# Patient Record
Sex: Male | Born: 1990 | Race: White | Hispanic: Yes | Marital: Single | Smoking: Never smoker
Health system: Southern US, Community
[De-identification: ages and names within clinical notes are randomized; demographics above are authoritative.]

---

## 2019-06-08 ENCOUNTER — Ambulatory Visit (INDEPENDENT_AMBULATORY_CARE_PROVIDER_SITE_OTHER): Payer: Self-pay

## 2019-06-08 ENCOUNTER — Ambulatory Visit
Admission: EM | Admit: 2019-06-08 | Discharge: 2019-06-08 | Disposition: A | Discharge status: Home / Self Care | Payer: Self-pay | Attending: Emergency Medicine | Admitting: Emergency Medicine

## 2019-06-08 ENCOUNTER — Other Ambulatory Visit: Payer: Self-pay

## 2019-06-08 DIAGNOSIS — W2209XA Striking against other stationary object, initial encounter: Secondary | ICD-10-CM

## 2019-06-08 DIAGNOSIS — M79641 Pain in right hand: Secondary | ICD-10-CM

## 2019-06-08 DIAGNOSIS — S62336A Displaced fracture of neck of fifth metacarpal bone, right hand, initial encounter for closed fracture: Secondary | ICD-10-CM

## 2019-06-08 MED ORDER — HYDROCODONE-ACETAMINOPHEN 5-325 MG PO TABS: 1.0000 | ORAL_TABLET | Freq: Four times a day (QID) | ORAL | 0 refills | Status: AC | PRN

## 2019-06-08 NOTE — Discharge Instructions (Addendum)
Continue ice, elevate above your heart is much as possible.  Take a Tylenol containing product 3-4 times a day as needed for pain.  Other 1000 mg of Tylenol for mild to moderate pain or 1-2 Norco for severe pain.  Do not exceed 4000 mg of Tylenol from all sources in 1 day.  Follow-up with Dr. Elder Cyphers for orthopedics, with [facility] urgent care within the week.  This may require surgical management.

## 2019-06-08 NOTE — ED Triage Notes (Signed)
Pt reports he punched a metal door yesterday afternoon and now with pain and swelling to right hand

## 2019-06-08 NOTE — ED Provider Notes (Signed)
HPI  SUBJECTIVE:  Tony Ferguson is a right-handed 29 y.o. male who presents with pain, swelling in his right lateral hand after punching a metal door yesterday.  He reports sharp intermittent pain in this area and some limitation of motion of his little finger.  Pain is present with activity and palpation.  He denies numbness or tingling in his fingers, bruising.  States the rest of his hand and wrist are without injury.  He tried ice with improvement in his symptoms.  Symptoms are worse with palpation, movement of the little finger.  Past medical history negative for diabetes, hypertension, smoking.  PMD: None.  History reviewed. No pertinent past medical history.  History reviewed. No pertinent surgical history.  History reviewed. No pertinent family history.  Social History   Tobacco Use  . Smoking status: Never Smoker  . Smokeless tobacco: Never Used  Substance Use Topics  . Alcohol use: Yes    Comment: social  . Drug use: Not Currently    No current facility-administered medications for this encounter.  Current Outpatient Medications:  .  HYDROcodone-acetaminophen (NORCO/VICODIN) 5-325 MG tablet, Take 1-2 tablets by mouth every 6 (six) hours as needed for moderate pain or severe pain., Disp: 12 tablet, Rfl: 0  No Known Allergies   ROS  As noted in HPI.   Physical Exam  BP (!) 141/76   Pulse 88   Temp 98.3 F (36.8 C) (Oral)   Resp 18   Ht 5\' 11"  (1.803 m)   Wt 122.5 kg   SpO2 99%   BMI 37.66 kg/m   Constitutional: Well developed, well nourished, no acute distress Eyes:  EOMI, conjunctiva normal bilaterally HENT: Normocephalic, atraumatic,mucus membranes moist Respiratory: Normal inspiratory effort Cardiovascular: Normal rate GI: nondistended skin: No rash, skin intact Musculoskeletal:  R hand: positive tender swelling over the distal fifth metacarpal of the right hand.  No obvious bruising.  No rotational deformity of the little finger.  Skin intact. light  touch intact for Pt, distal motor and sensation in median/radial/ulnar nerve distribution with CR< 2 secs and pulse intact.  Some limitation of motion of the little finger but patient is able to move it.  Rest of the hand nontender, normal, wrist WNL.  Patient has full range of motion of the wrist. Neurologic: Alert & oriented x 3, no focal neuro deficits Psychiatric: Speech and behavior appropriate   ED Course   Medications - No data to display  Orders Placed This Encounter  Procedures  . DG Hand Complete Right    Standing Status:   Standing    Number of Occurrences:   1    Order Specific Question:   Reason for Exam (SYMPTOM  OR DIAGNOSIS REQUIRED)    Answer:   pain  . Apply splint Ulnar Gutter    Standing Status:   Standing    Number of Occurrences:   1    Order Specific Question:   Laterality    Answer:   Right    No results found for this or any previous visit (from the past 24 hour(s)). DG Hand Complete Right  Result Date: 06/08/2019 CLINICAL DATA:  Pain after punching a metal door EXAM: RIGHT HAND - COMPLETE 3+ VIEW COMPARISON:  None. FINDINGS: Comminuted fracture of the fifth metacarpal neck with apex dorsal angulation. No other acute fracture or dislocation. No aggressive osseous lesion. Soft tissues are unremarkable. IMPRESSION: Comminuted fracture of the fifth metacarpal neck with apex dorsal angulation. Electronically Signed   By: Elbert Ewings  Allena Katz   On: 06/08/2019 13:51    ED Clinical Impression  1. Closed displaced fracture of neck of fifth metacarpal bone of right hand, initial encounter      ED Assessment/Plan  [place] [facility] database reviewed for this patient, and feel that the risk/benefit ratio today is favorable for proceeding with a prescription for controlled substance.  No opiate prescriptions in 2 years.  Reviewed imaging independently.  Comminuted fifth metacarpal neck fracture with apex dorsal angulation.  See radiology report for full details.  Patient with  a boxer's fracture.  He is neurovascularly intact.  Will place in ulnar gutter splint and have him follow-up with Dr. Stephenie Acres, Lala Lund, or [facility] urgent care in [place] this week.  Home with Tylenol or Norco 3-4 times a day  Discussed  imaging, MDM, treatment plan, and plan for follow-up with patient. Discussed sn/sx that should prompt return to the ED. patient agrees with plan.   Meds ordered this encounter  Medications  . HYDROcodone-acetaminophen (NORCO/VICODIN) 5-325 MG tablet    Sig: Take 1-2 tablets by mouth every 6 (six) hours as needed for moderate pain or severe pain.    Dispense:  12 tablet    Refill:  0    *This clinic note was created using Scientist, clinical (histocompatibility and immunogenetics). Therefore, there may be occasional mistakes despite careful proofreading.   ?    Domenick Gong, MD 06/08/19 1355

## 2021-08-29 IMAGING — CR DG HAND COMPLETE 3+V*R*
3 series · 3 of 3 positions shown · non-contrast
Comparison: None.

CLINICAL DATA: Pain after punching a metal door

EXAM:
RIGHT HAND - COMPLETE 3+ VIEW

[hand ap]
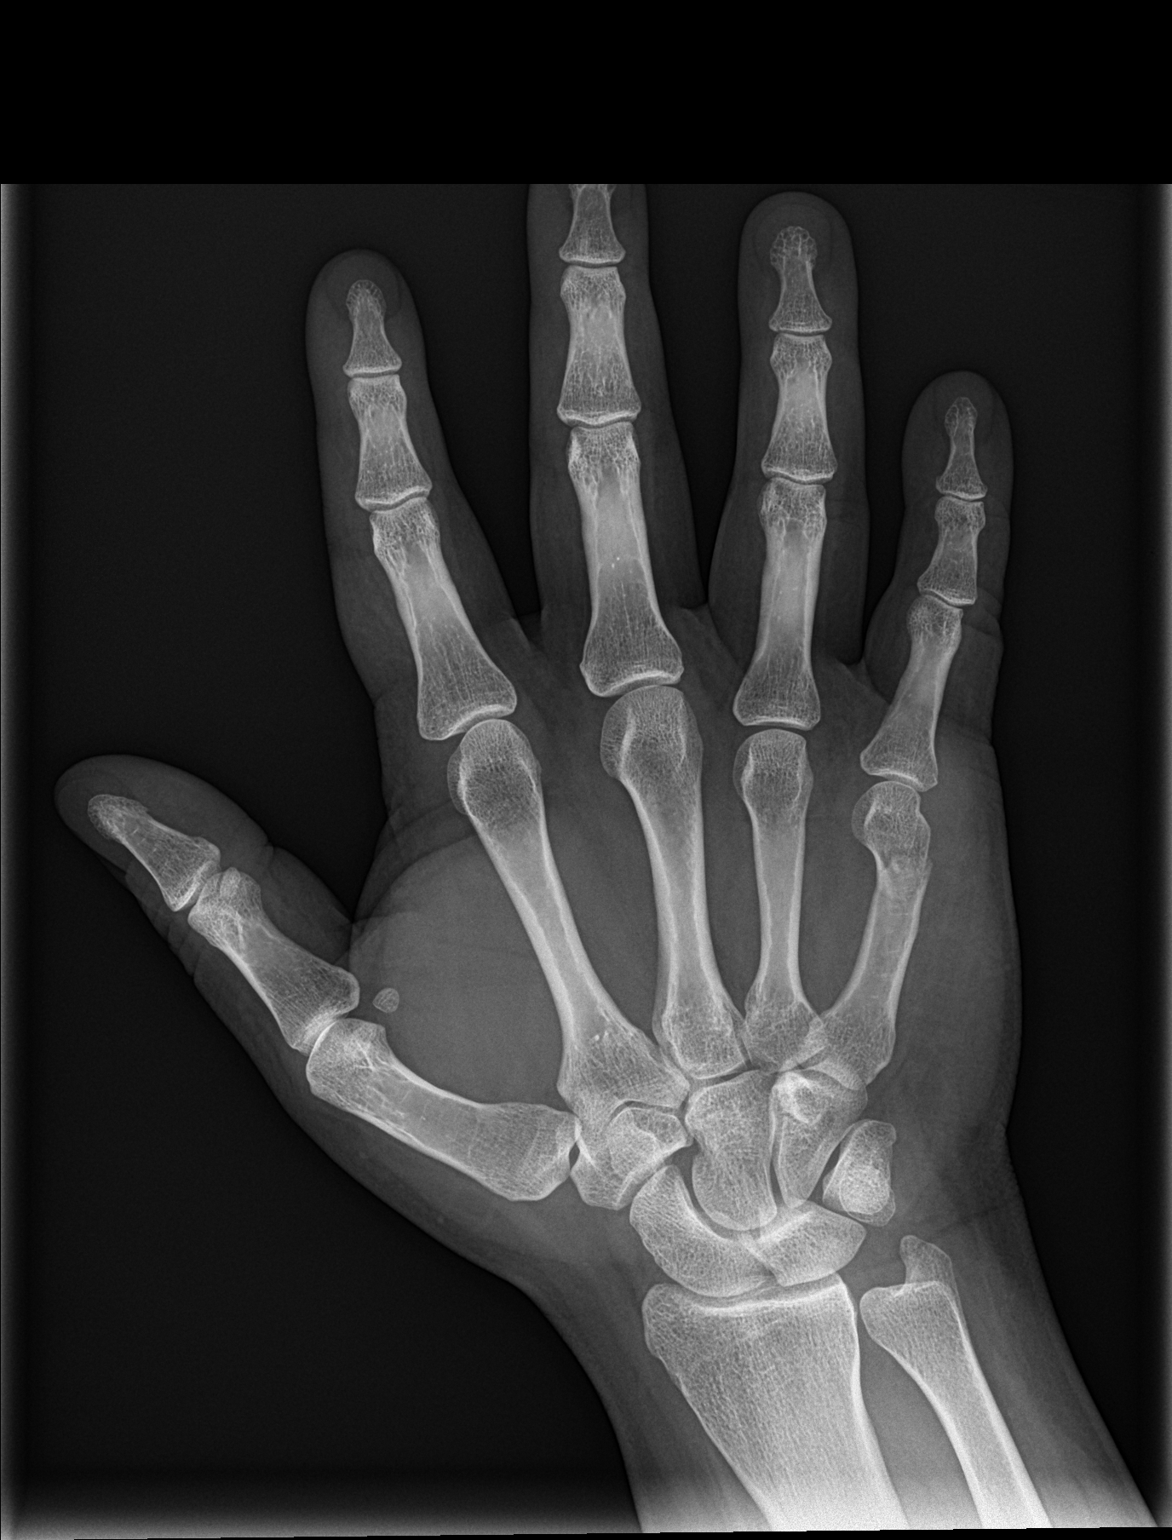

[hand obl]
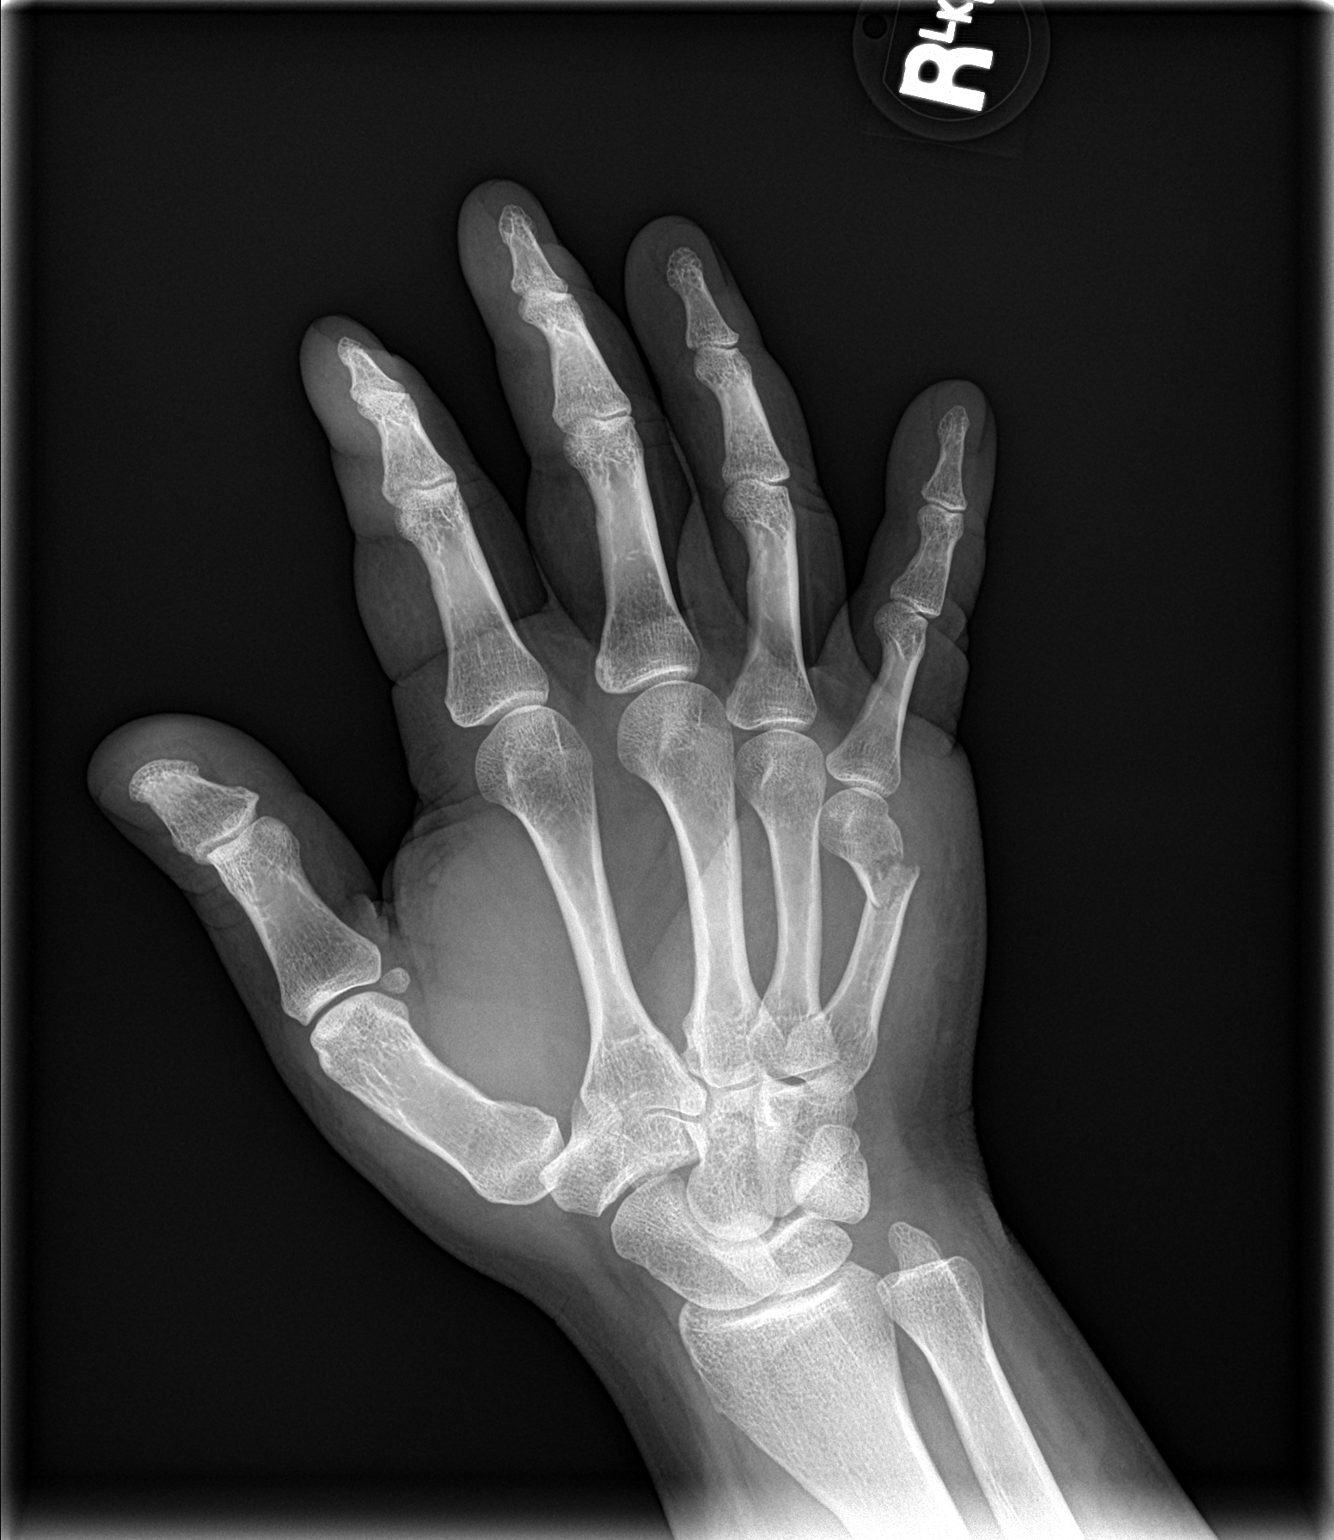

[hand lat]
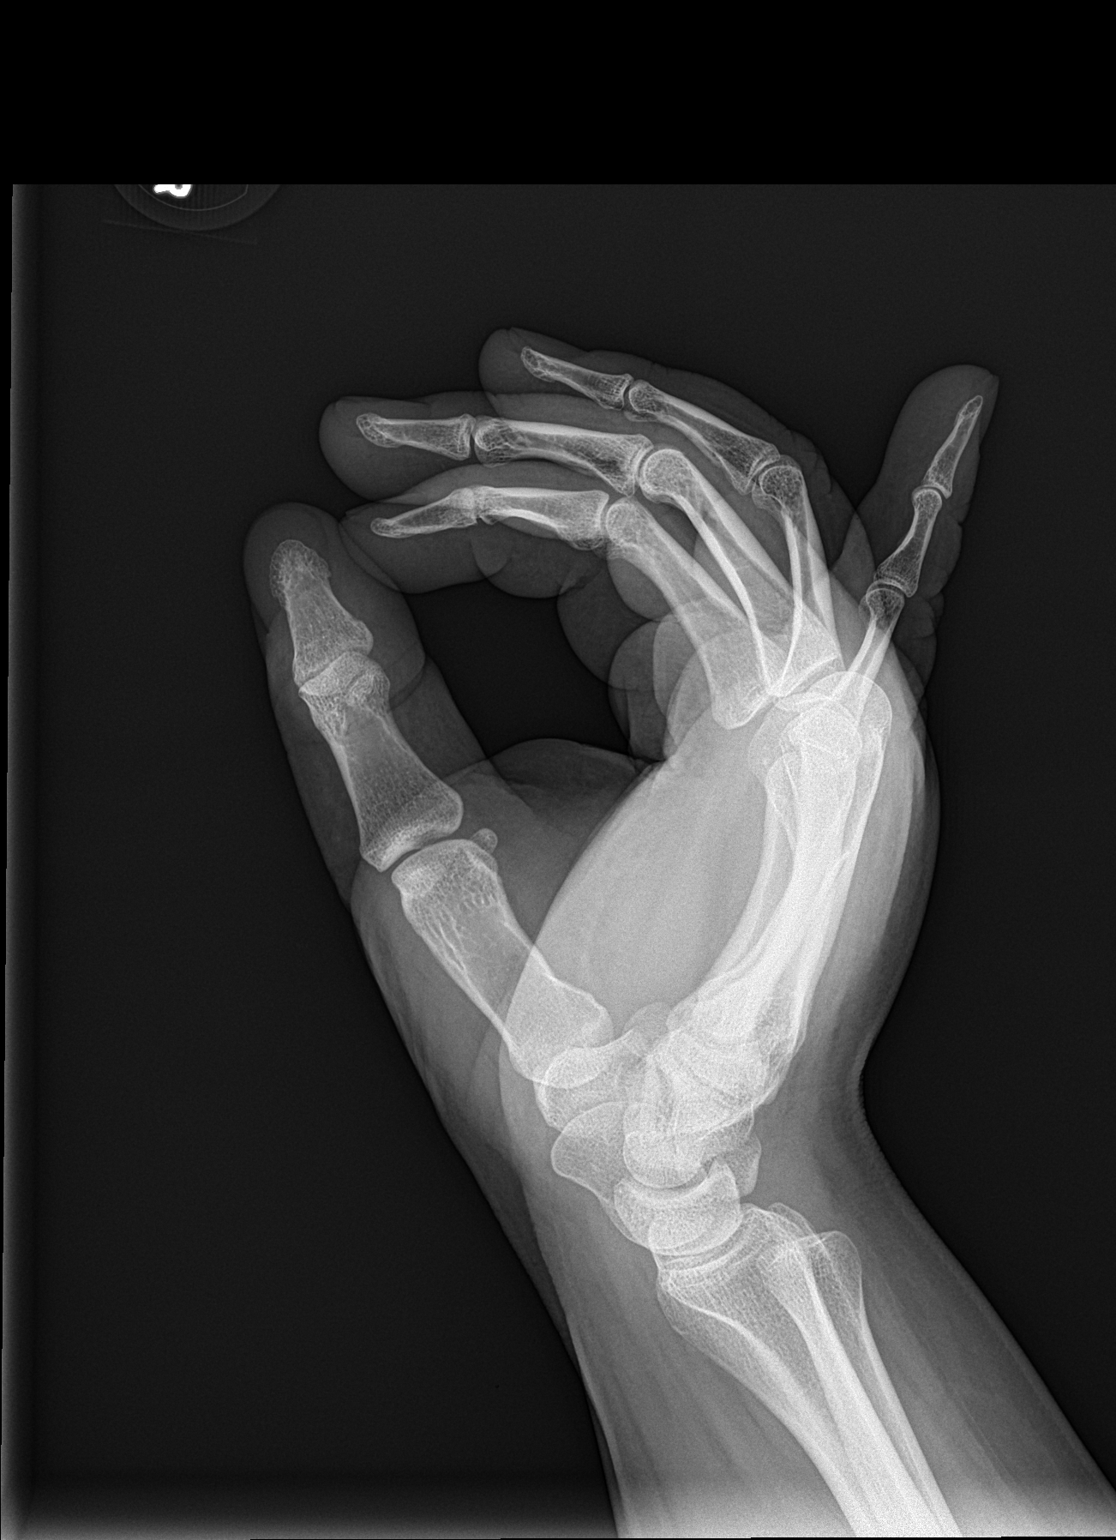

[3 of 3 positions shown; findings below may reference images not displayed]

FINDINGS: Comminuted fracture of the fifth metacarpal neck with apex dorsal
angulation. No other acute fracture or dislocation. No aggressive
osseous lesion. Soft tissues are unremarkable.
IMPRESSION: Comminuted fracture of the fifth metacarpal neck with apex dorsal
angulation.
